# Patient Record
Sex: Male | Born: 1990 | Race: White | Hispanic: No | Marital: Single | Smoking: Never smoker
Health system: Southern US, Community
[De-identification: ages and names within clinical notes are randomized; demographics above are authoritative.]

## PROBLEM LIST (undated history)

## (undated) DIAGNOSIS — F419 Anxiety disorder, unspecified: Secondary | ICD-10-CM

## (undated) HISTORY — DX: Anxiety disorder, unspecified: F41.9

---
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
## 2011-09-01 IMAGING — CT CT ENTEROGRAPHY (ABD-PELV W/ CM)
2 of 7 series · 13 of 46 positions shown, 19 images · IV contrast (VOLUMEN & [ID] OMNI 300)
Comparison: None

CLINICAL DATA: Abdominal pain, diarrhea and melena

CT ABDOMEN AND PELVIS WITH CONTRAST (CT ENTEROGRAPHY)
TECHNIQUE: Multidetector CT of the abdomen and pelvis during bolus
administration of intravenous contrast. Negative oral contrast
VoLumen was given.
Contrast: 100 ml Lmnipaque-VYY intravenously and 13 50 ml VoLumen
orally.

[Series 3: enterography (id) · axial · 0.74mm/px · z∈[-386,-11]mm · 10 of 180 slices shown, 16 images]
[im 15/180  soft-tissue]
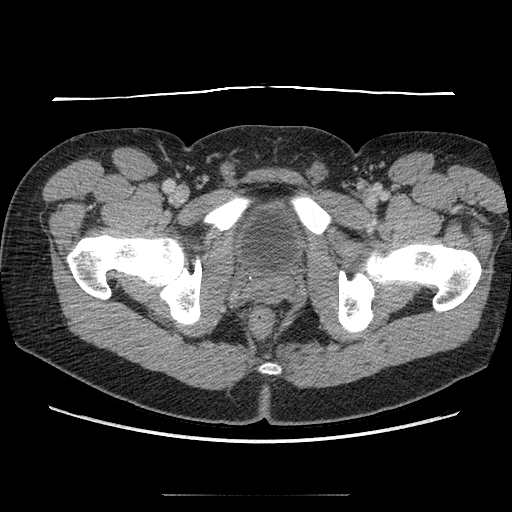
[im 15/180  bone]
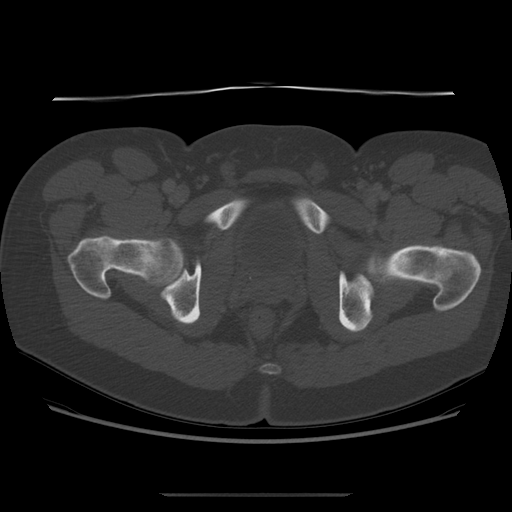
[im 30/180  soft-tissue]
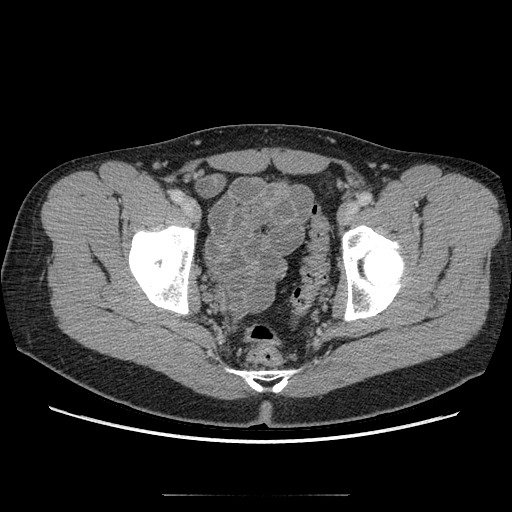
[im 45/180  soft-tissue]
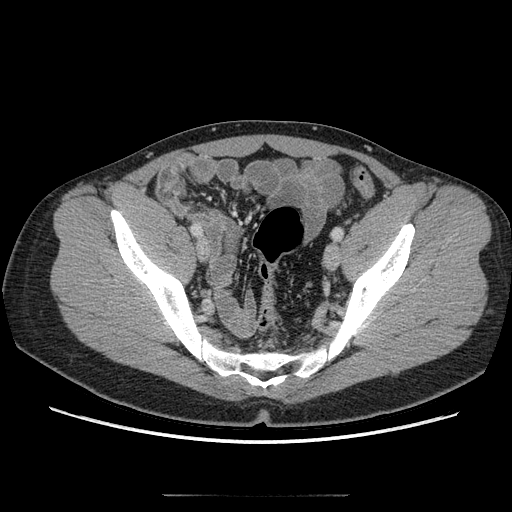
[im 60/180  soft-tissue]
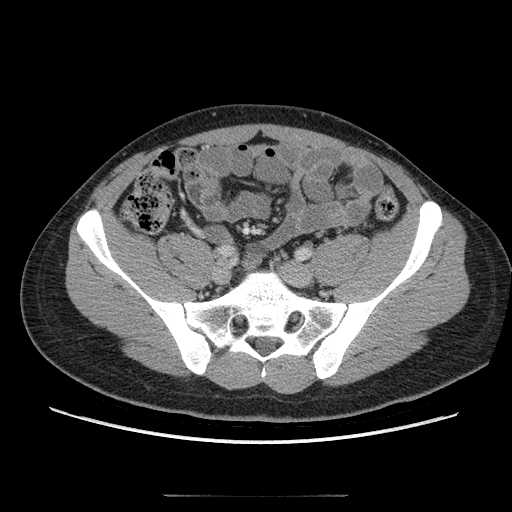
[im 75/180  soft-tissue]
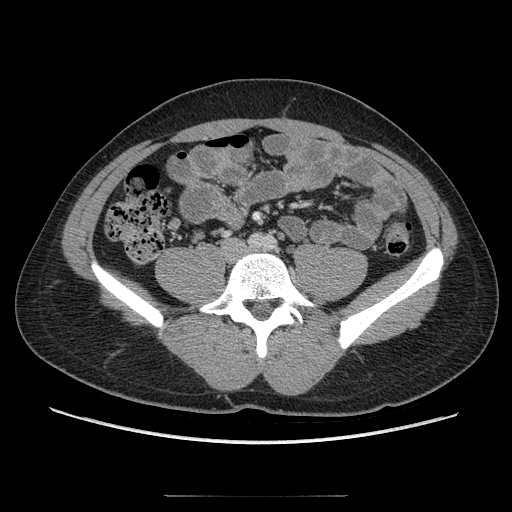
[im 105/180  soft-tissue]
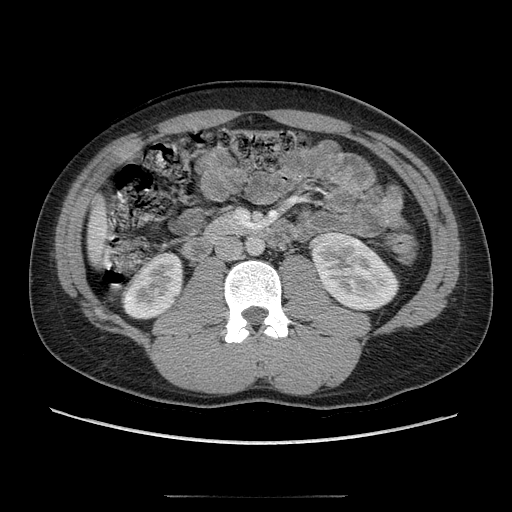
[im 120/180  soft-tissue]
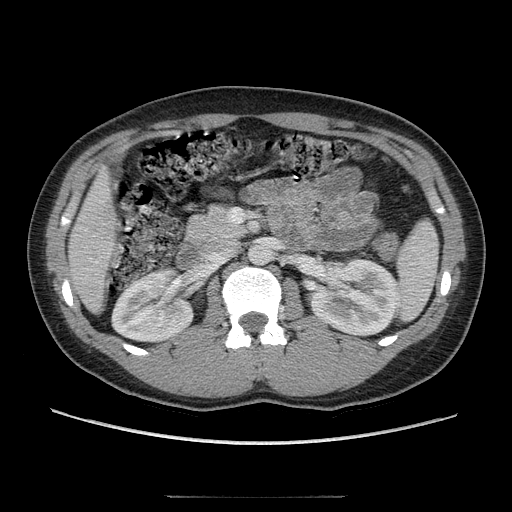
[im 120/180  lung]
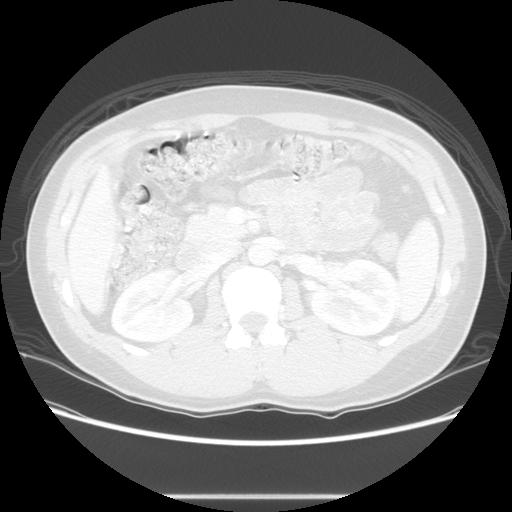
[im 135/180  soft-tissue]
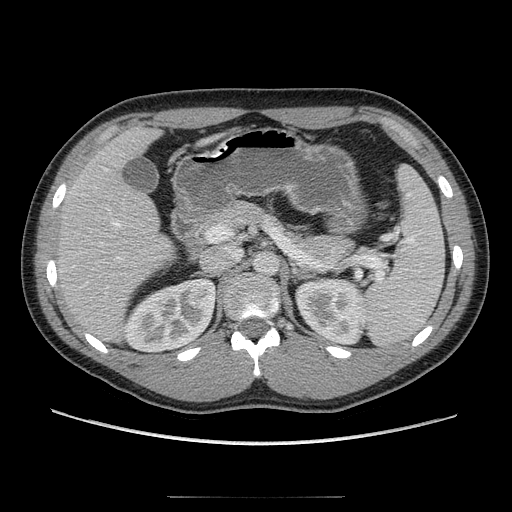
[im 135/180  lung]
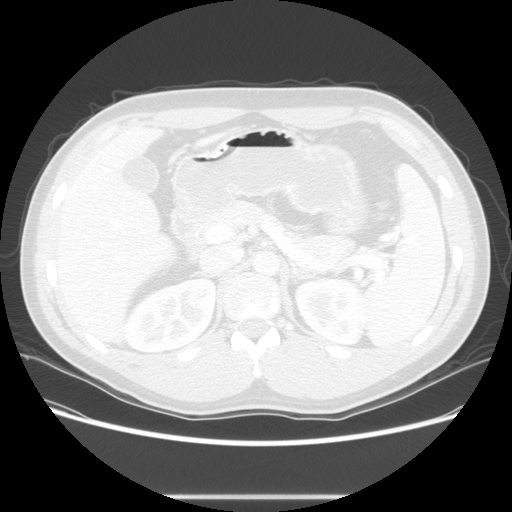
[im 150/180  soft-tissue]
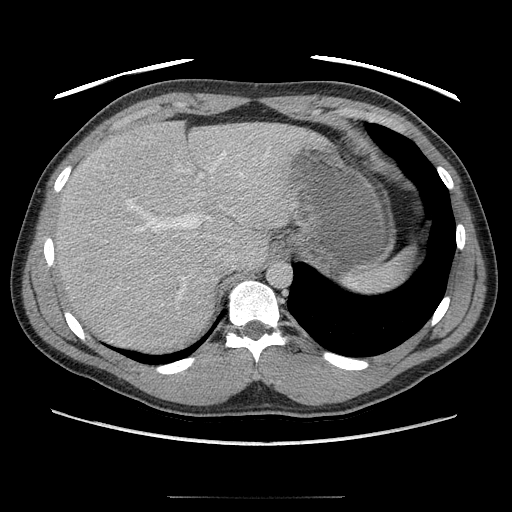
[im 150/180  lung]
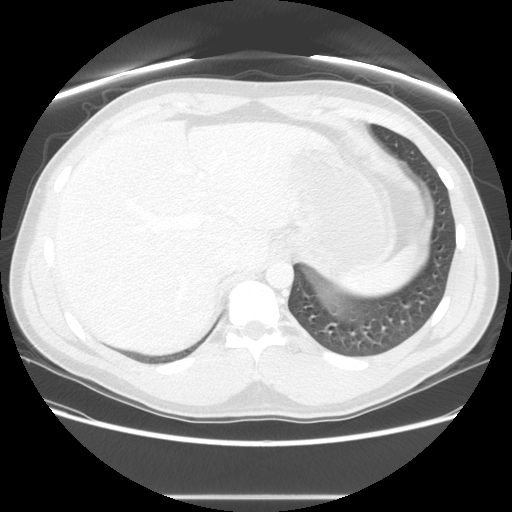
[im 150/180  bone]
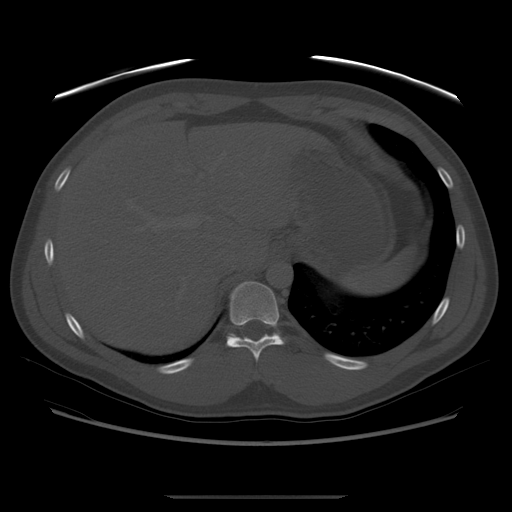
[im 165/180  soft-tissue]
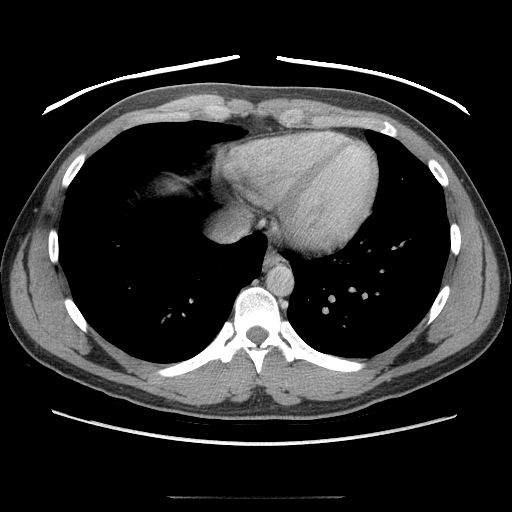
[im 165/180  lung]
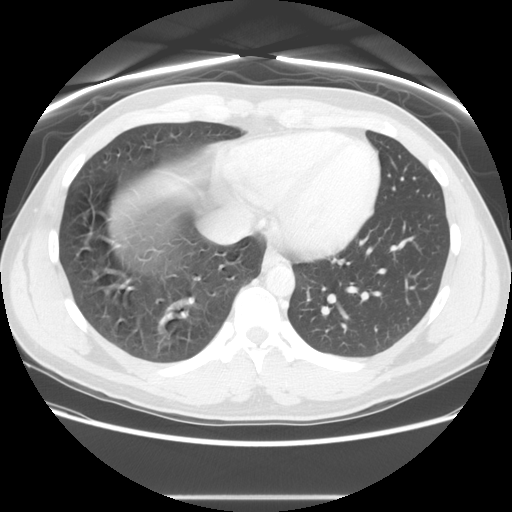

[Series 201: coronal · coronal · 0.92mm/px · 3 of 111 slices shown]
[im 37/111  soft-tissue]
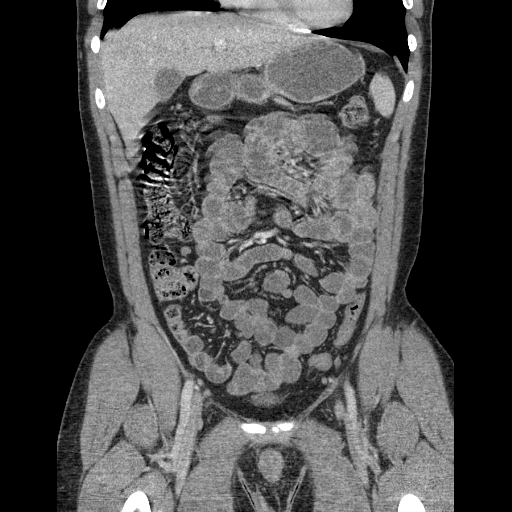
[im 49/111  soft-tissue]
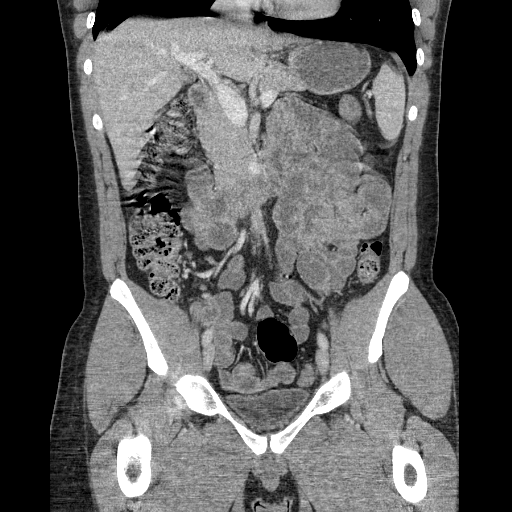
[im 62/111  soft-tissue]
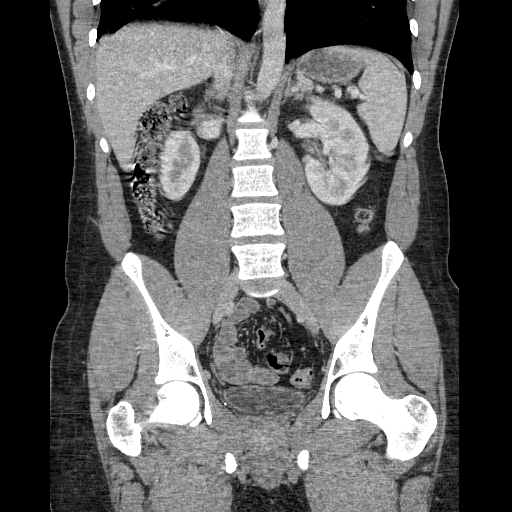

[13 of 46 positions shown; findings below may reference images not displayed]

FINDINGS: The lung bases are clear.

The patient had a difficult time with breath holding so there is
moderate motion artifact.

The liver is unremarkable.  No focal lesions or biliary dilatation
to the gallbladder is normal.  No common bile duct dilatation.  The
pancreas is normal.  The spleen is normal in size.  No focal
lesions.

The adrenal glands and kidneys are unremarkable.

The stomach, duodenum, small bowel and colon are grossly normal.
No findings for inflammatory bowel disease.  The terminal ileum is
unremarkable.  No abnormal enhancement.  Moderate stool throughout
the colon may suggest constipation.

The aorta is normal in caliber.  No dissection.  The major branch
vessels are normal.  No mesenteric or retroperitoneal masses or
adenopathy.  There are scattered mesenteric and pericecal lymph
nodes.

The bladder, prostate gland and seminal vesicles are unremarkable.
No pelvic mass, adenopathy or free pelvic fluid collections.  No
inguinal mass or hernia.  The lumbar vertebral bodies are
unremarkable.
IMPRESSION: 1.  Exam somewhat limited by breathing motion.
2.  No CT findings for inflammatory bowel disease, abdominal/pelvic
mass lesions or adenopathy.
3.  Moderate stool throughout the colon may suggest constipation.

## 2020-12-20 DIAGNOSIS — Z20822 Contact with and (suspected) exposure to covid-19: Secondary | ICD-10-CM | POA: Diagnosis not present

## 2021-01-03 DIAGNOSIS — F1120 Opioid dependence, uncomplicated: Secondary | ICD-10-CM | POA: Diagnosis not present

## 2021-01-03 DIAGNOSIS — F339 Major depressive disorder, recurrent, unspecified: Secondary | ICD-10-CM | POA: Diagnosis not present

## 2021-01-03 DIAGNOSIS — F411 Generalized anxiety disorder: Secondary | ICD-10-CM | POA: Diagnosis not present

## 2021-01-03 DIAGNOSIS — F1020 Alcohol dependence, uncomplicated: Secondary | ICD-10-CM | POA: Diagnosis not present

## 2021-01-17 DIAGNOSIS — F1020 Alcohol dependence, uncomplicated: Secondary | ICD-10-CM | POA: Diagnosis not present

## 2021-01-17 DIAGNOSIS — F339 Major depressive disorder, recurrent, unspecified: Secondary | ICD-10-CM | POA: Diagnosis not present

## 2021-01-17 DIAGNOSIS — F411 Generalized anxiety disorder: Secondary | ICD-10-CM | POA: Diagnosis not present

## 2021-01-17 DIAGNOSIS — F1120 Opioid dependence, uncomplicated: Secondary | ICD-10-CM | POA: Diagnosis not present

## 2021-02-07 DIAGNOSIS — F411 Generalized anxiety disorder: Secondary | ICD-10-CM | POA: Diagnosis not present

## 2021-02-07 DIAGNOSIS — F339 Major depressive disorder, recurrent, unspecified: Secondary | ICD-10-CM | POA: Diagnosis not present

## 2021-02-07 DIAGNOSIS — F1020 Alcohol dependence, uncomplicated: Secondary | ICD-10-CM | POA: Diagnosis not present

## 2021-02-07 DIAGNOSIS — F1120 Opioid dependence, uncomplicated: Secondary | ICD-10-CM | POA: Diagnosis not present

## 2021-12-27 ENCOUNTER — Encounter: Payer: Self-pay | Admitting: Family Medicine

## 2021-12-27 ENCOUNTER — Ambulatory Visit (INDEPENDENT_AMBULATORY_CARE_PROVIDER_SITE_OTHER): Payer: 59 | Admitting: Family Medicine

## 2021-12-27 VITALS — BP 118/78 | HR 70 | Temp 97.8°F | Ht 70.0 in | Wt 161.8 lb

## 2021-12-27 DIAGNOSIS — R3911 Hesitancy of micturition: Secondary | ICD-10-CM

## 2021-12-27 LAB — POCT URINALYSIS DIPSTICK
Bilirubin, UA: NEGATIVE
Blood, UA: NEGATIVE
Glucose, UA: NEGATIVE
Ketones, UA: NEGATIVE
Leukocytes, UA: NEGATIVE
Nitrite, UA: NEGATIVE
Protein, UA: NEGATIVE
Spec Grav, UA: <=1.005 — AB (ref 1.010–1.025)
Urobilinogen, UA: 0.2 E.U./dL
pH, UA: 6 (ref 5.0–8.0)

## 2021-12-27 NOTE — Assessment & Plan Note (Signed)
Urinary hesitancy, worsening Not associated with any other symptoms as outlined in HPI Broad differential including anything from bladder dysfunction, endocrine dysfunction, prostate hypertrophy or prostatitis, infection, medication side effects, neurological conditions, however most concerning is possible prostate malignancy, did discuss this with patient, patient acknowledged understanding seriousness of the matter  did recommend testing as outlined below but also discussed possible referral referral in the near future UA is negative, making infection less likely, however will send for urine culture PSA to evaluate for prostate enlargement We will have the patient discontinue any diuretics from home including alcohol and caffeine Zyrtec can have possible anticholinergic side effects, leading to urinary retention, will have patient discontinue this as well Patient to keep a diary of symptoms Abdominal and pelvic ultrasound to evaluate anatomy or signs of obstruction or bladder distention Patient follow-up 1 month Any abnormalities or progression of symptoms, low threshold to refer to urology

## 2021-12-27 NOTE — Progress Notes (Signed)
New Patient Office Visit  Subjective    Patient ID: Jeff Richardson, Jeff Richardson    DOB: 05-31-1991  Age: 31 y.o. MRN: [id number]  CC:  Chief Complaint  Patient presents with   Establish Care    For 3-4 months patient has been having issues starting to urinate. It feels like he has to go , but can't.     HPI Jeff Richardson presents to establish care.   Patient presents with 3 months of urinary hesitancy.  It is intermittent, but has become more frequent.  Patient does not know external pattern of this, but he is he has difficulty voiding by initiation sometimes, but other times he feels like he cannot completely empty his bladder.  Patient has not had any hematuria with this, denies discharge.  Has no erectile or ejaculatory problems.  Patient does use OTC Zyrtec for allergies.  Patient drinks occasional alcohol and does drink coffee during the day.  Patient does endorse a rash that red.  Seems to come and go.  It is worse on his neck.  It does seem to flare when he gets anxious.  This is mostly in social situations and in the public.  Patient denies any abdominal pain, nausea, vomiting, fevers, chills, constipation, diarrhea  No outpatient encounter medications on file as of 12/27/2021.   No facility-administered encounter medications on file as of 12/27/2021.    History reviewed. No pertinent past medical history.  History reviewed. No pertinent surgical history.  History reviewed. No pertinent family history.  Social History   Socioeconomic History   Marital status: Single    Spouse name: Not on file   Number of children: Not on file   Years of education: Not on file   Highest education level: Not on file  Occupational History   Not on file  Tobacco Use   Smoking status: Never   Smokeless tobacco: Never  Vaping Use   Vaping Use: Never used  Substance and Sexual Activity   Alcohol use: Never   Drug use: Never   Sexual activity: Not on file  Other Topics Concern   Not on  file  Social History Narrative   Not on file   Social Determinants of Health   Financial Resource Strain: Not on file  Food Insecurity: Not on file  Transportation Needs: Not on file  Physical Activity: Not on file  Stress: Not on file  Social Connections: Not on file  Intimate Partner Violence: Not on file    ROS As per HPI     Objective    BP 118/78 (BP Location: Right Arm, Patient Position: Sitting, Cuff Size: Large)   Pulse 70   Temp 97.8 F (36.6 C) (Temporal)   Ht 5\' 10"  (1.778 m)   Wt 161 lb 12.8 oz (73.4 kg)   SpO2 99%   BMI 23.22 kg/m   Gen: NAD, resting comfortably CV: RRR with no murmurs appreciated Pulm: NWOB, CTAB with no crackles, wheezes, or rhonchi GI: Normal bowel sounds present. Soft, Nontender, Nondistended. MSK: no edema, cyanosis, or clubbing noted Skin: warm, dry Neuro: grossly normal, moves all extremities Psych: Normal affect and thought content       Assessment & Plan:   Problem List Items Addressed This Visit       Other   Urinary hesitancy - Primary   Relevant Orders   POCT Urinalysis Dipstick (Completed)   Urine Culture   PSA, total and free   Abdomen Complete   US Pelvis  Complete    Return in about 4 weeks (around 01/24/2022) for urinary hesitancy.   Garnette Gunner, MD

## 2021-12-27 NOTE — Patient Instructions (Signed)
Stop Diuretics like Alcohol and Caffeine Keep a diary of urinary progression Stop Zyrtec Today we are getting prostate test, sending for culture, and ordering an ultrasound of bladder and kidneys

## 2021-12-28 ENCOUNTER — Telehealth: Payer: Self-pay

## 2021-12-28 LAB — URINE CULTURE
MICRO NUMBER:: 13530477
Result:: NO GROWTH
SPECIMEN QUALITY:: ADEQUATE

## 2021-12-28 NOTE — Telephone Encounter (Signed)
Jeff Richardson from [facility] called.  She needs to speak with someone regarding the referrals made on 12/28/21. US Pelvis Complete and US Abdomen Complete The dx code is for urinary hesitancy. She said it looks like it should be a renal study but she can't make that decision without a Dr's approval.  Her call back number is [phone], option 1, then option 5

## 2021-12-31 ENCOUNTER — Encounter: Payer: Self-pay | Admitting: Family Medicine

## 2021-12-31 LAB — PSA, TOTAL AND FREE
PSA, % Free: 100 % (calc) (ref >25)
PSA, Free: 0.1 ng/mL
PSA, Total: 0.1 ng/mL (ref <=4.0)

## 2021-12-31 NOTE — Telephone Encounter (Signed)
Please advise, see below.   

## 2021-12-31 NOTE — Telephone Encounter (Signed)
Left Arline Asp a detailed voice message to return call to office.

## 2022-01-23 ENCOUNTER — Telehealth: Payer: Self-pay | Admitting: Family Medicine

## 2022-01-23 NOTE — Telephone Encounter (Signed)
Pt wanted to know if he should come to his appointment tomorrow because pt never had his ultrasound done and pt stated that no one ever contact him

## 2022-01-24 ENCOUNTER — Telehealth: Payer: Self-pay | Admitting: Family Medicine

## 2022-01-24 ENCOUNTER — Ambulatory Visit: Payer: Self-pay | Admitting: Family Medicine

## 2022-01-24 NOTE — Telephone Encounter (Signed)
Spoke with patient and he states that he spoke with imaging on 6/19 and they didn't accept his Friday insurance. Patient has changed insurance companies and will bring in new info at visit today with PCP.

## 2022-01-24 NOTE — Telephone Encounter (Signed)
7.13.2023 no show letter sent

## 2022-02-15 NOTE — Telephone Encounter (Signed)
1st no show, fee waived, letter sent 

## 2022-10-23 ENCOUNTER — Encounter: Payer: Self-pay | Admitting: Family Medicine

## 2022-10-23 ENCOUNTER — Ambulatory Visit: Payer: BLUE CROSS/BLUE SHIELD | Admitting: Family Medicine

## 2022-10-23 VITALS — BP 138/80 | HR 85 | Temp 98.5°F | Ht 71.0 in | Wt 171.5 lb

## 2022-10-23 DIAGNOSIS — F4000 Agoraphobia, unspecified: Secondary | ICD-10-CM

## 2022-10-23 DIAGNOSIS — F411 Generalized anxiety disorder: Secondary | ICD-10-CM | POA: Diagnosis not present

## 2022-10-23 MED ORDER — PROPRANOLOL HCL 10 MG PO TABS: ORAL_TABLET | ORAL | 1 refills | Status: DC

## 2022-10-23 MED ORDER — CITALOPRAM HYDROBROMIDE 20 MG PO TABS: ORAL_TABLET | ORAL | 0 refills | Status: DC

## 2022-10-23 NOTE — Progress Notes (Signed)
Chief Complaint  Patient presents with   New Patient (Initial Visit)   Anxiety    Subjective Jeff Richardson is an 32 y.o. male who presents with anxiet. Symptoms began getting worse around 6 mo ago.  Anxiety symptoms: difficulty concentrating, fatigue, irritable, palpitations, psychomotor agitation, racing thoughts, sweating. Depressive symptoms None Family history significant for anxiety in mom and on her side of family Possible organic causes contributing are: none Social stressors include: his dad's worsening health No SI or HI. No self medication.  He is currently being treated with nothing; has been on 2 SSRI's in college but does not remember their names. Has been on vistaril and propranolol which worked well also.  He is not following with a psychologist.  Past Medical History:  Diagnosis Date   Anxiety      Family History Family History  Problem Relation Age of Onset   Heart disease Father    Diabetes Father    Breast cancer Maternal Grandmother    Lung cancer Maternal Grandfather    Colon cancer Neg Hx    Prostate cancer Neg Hx     Exam BP 138/80 (BP Location: Left Arm, Patient Position: Sitting, Cuff Size: Normal)   Pulse 85   Temp 98.5 F (36.9 C) (Oral)   Ht 5\' 11"  (1.803 m)   Wt 171 lb 8 oz (77.8 kg)   SpO2 99%   BMI 23.92 kg/m  General:  well developed, well nourished, in no apparent distress Heart: RRR Lungs:  CTAB. normal respiratory effort without accessory muscle use Psych: well oriented with normal range of affect and age-appropriate judgement/insight  Assessment and Plan  GAD (generalized anxiety disorder) - Plan: propranolol (INDERAL) 10 MG tablet, citalopram (CELEXA) 20 MG tablet  Agoraphobia - Plan: propranolol (INDERAL) 10 MG tablet, citalopram (CELEXA) 20 MG tablet  Chronic, uncontrolled. Start Celexa 10 mg/d for 2 weeks and then increase to 20 mg/d. Start propranolol 10 mg TID prn. Counseling info provided. Counseled on exercise. F/u in 6  weeks.  Patient voiced understanding and agreement to the plan.  Jilda Roche [place], DO 10/23/22 2:00 PM

## 2022-10-23 NOTE — Patient Instructions (Signed)
Aim to do some physical exertion for 150 minutes per week. This is typically divided into 5 days per week, 30 minutes per day. The activity should be enough to get your heart rate up. Anything is better than nothing if you have time constraints.  Please consider counseling. Contact [phone] to schedule an appointment or inquire about cost/insurance coverage.  [facility] located at [address], [place], [place].  Phone number = [phone].  Dr. Onoriode Edeh - Adult Psychiatry.    [facility] located at [address], [place], [place]. Phone number = [phone].   [facility] located at [address], [place], [place].  Phone number = [phone].   [facility] located at [address], [place], [place].  Phone number = [phone].  Coping skills Choose 5 that work for you: Take a deep breath Count to 20 Read a book Do a puzzle Meditate Bake Sing Knit Garden Pray Go outside Call a friend Listen to music Take a walk Color Send a note Take a bath Watch a movie Be alone in a quiet place Pet an animal Visit a friend Journal Exercise Stretch   

## 2022-11-28 ENCOUNTER — Other Ambulatory Visit: Payer: Self-pay | Admitting: Family Medicine

## 2022-11-28 DIAGNOSIS — F4000 Agoraphobia, unspecified: Secondary | ICD-10-CM

## 2022-11-28 DIAGNOSIS — F411 Generalized anxiety disorder: Secondary | ICD-10-CM

## 2022-11-28 MED ORDER — CITALOPRAM HYDROBROMIDE 20 MG PO TABS: ORAL_TABLET | ORAL | 0 refills | Status: DC

## 2022-12-04 ENCOUNTER — Ambulatory Visit: Payer: BLUE CROSS/BLUE SHIELD | Admitting: Family Medicine

## 2022-12-11 ENCOUNTER — Ambulatory Visit: Payer: BLUE CROSS/BLUE SHIELD | Admitting: Family Medicine

## 2022-12-11 VITALS — BP 138/86 | HR 78 | Temp 98.8°F | Ht 71.0 in | Wt 167.1 lb

## 2022-12-11 DIAGNOSIS — F411 Generalized anxiety disorder: Secondary | ICD-10-CM

## 2022-12-11 DIAGNOSIS — F4000 Agoraphobia, unspecified: Secondary | ICD-10-CM

## 2022-12-11 MED ORDER — PROPRANOLOL HCL 20 MG PO TABS: ORAL_TABLET | ORAL | 1 refills | Status: DC

## 2022-12-11 MED ORDER — CITALOPRAM HYDROBROMIDE 40 MG PO TABS: 40.0000 mg | ORAL_TABLET | Freq: Every day | ORAL | 3 refills | Status: DC

## 2022-12-11 NOTE — Progress Notes (Signed)
Chief Complaint  Patient presents with   Follow-up    Subjective Jeff Richardson presents for f/u anxiety.  Pt is currently being treated with Celexa 20 mg/d, propranolol 10 mg TID prn.  Reports 30-40% improvement since treatment. He uses the propranolol around twice per week before he goes out.  He states it does help take the edge off. No thoughts of harming self or others. No self-medication with alcohol, prescription drugs or illicit drugs. Pt is not following with a counselor/psychologist.  Past Medical History:  Diagnosis Date   Anxiety    Allergies as of 12/11/2022   No Known Allergies      Medication List        Accurate as of Dec 11, 2022  8:10 AM. If you have any questions, ask your nurse or doctor.          citalopram 40 MG tablet Commonly known as: CELEXA Take 1 tablet (40 mg total) by mouth daily. What changed:  medication strength how much to take how to take this when to take this additional instructions Changed by: Sharlene Dory, DO   propranolol 20 MG tablet Commonly known as: INDERAL Take 20 mg by mouth 3 times daily as needed for anxiety. What changed:  medication strength additional instructions Changed by: Sharlene Dory, DO        Exam BP 138/86 (BP Location: Left Arm, Patient Position: Sitting, Cuff Size: Normal)   Pulse 78   Temp 98.8 F (37.1 C) (Oral)   Ht 5\' 11"  (1.803 m)   Wt 167 lb 2 oz (75.8 kg)   SpO2 96%   BMI 23.31 kg/m  General:  well developed, well nourished, in no apparent distress Lungs:  No respiratory distress Psych: well oriented with normal range of affect and age-appropriate judgement/insight, alert and oriented x4.  Assessment and Plan  GAD (generalized anxiety disorder) - Plan: citalopram (CELEXA) 40 MG tablet  Agoraphobia - Plan: propranolol (INDERAL) 20 MG tablet  Chronic, uncontrolled. Increase Celexa from 20 mg daily to 40 mg daily.  Increase propranolol from 10 mg 3 times daily  as needed to 20 mg 3 times daily as needed.  Counseled on exercise. F/u in 6 weeks. The patient voiced understanding and agreement to the plan.  Jilda Roche [place], DO 12/11/22 8:10 AM

## 2022-12-11 NOTE — Patient Instructions (Signed)
Stay active.   Let me know if there are immediate issues.   Let us know if you need anything.

## 2023-01-24 ENCOUNTER — Ambulatory Visit (INDEPENDENT_AMBULATORY_CARE_PROVIDER_SITE_OTHER): Payer: BLUE CROSS/BLUE SHIELD | Admitting: Family Medicine

## 2023-01-24 ENCOUNTER — Encounter: Payer: Self-pay | Admitting: Family Medicine

## 2023-01-24 VITALS — BP 118/80 | HR 65 | Temp 98.9°F | Ht 71.0 in | Wt 179.1 lb

## 2023-01-24 DIAGNOSIS — Z13220 Encounter for screening for lipoid disorders: Secondary | ICD-10-CM | POA: Diagnosis not present

## 2023-01-24 DIAGNOSIS — Z0000 Encounter for general adult medical examination without abnormal findings: Secondary | ICD-10-CM | POA: Diagnosis not present

## 2023-01-24 DIAGNOSIS — Z114 Encounter for screening for human immunodeficiency virus [HIV]: Secondary | ICD-10-CM | POA: Diagnosis not present

## 2023-01-24 DIAGNOSIS — Z1159 Encounter for screening for other viral diseases: Secondary | ICD-10-CM

## 2023-01-24 DIAGNOSIS — Z23 Encounter for immunization: Secondary | ICD-10-CM

## 2023-01-24 LAB — CBC
HCT: 41.3 % (ref 39.0–52.0)
Hemoglobin: 13.6 g/dL (ref 13.0–17.0)
MCHC: 33 g/dL (ref 30.0–36.0)
MCV: 98.5 fl (ref 78.0–100.0)
Platelets: 239 10*3/uL (ref 150.0–400.0)
RBC: 4.19 Mil/uL — ABNORMAL LOW (ref 4.22–5.81)
RDW: 12.7 % (ref 11.5–15.5)
WBC: 4 10*3/uL (ref 4.0–10.5)

## 2023-01-24 LAB — COMPREHENSIVE METABOLIC PANEL
ALT: 21 U/L (ref 0–53)
AST: 21 U/L (ref 0–37)
Albumin: 4.6 g/dL (ref 3.5–5.2)
Alkaline Phosphatase: 86 U/L (ref 39–117)
BUN: 15 mg/dL (ref 6–23)
CO2: 31 mEq/L (ref 19–32)
Calcium: 9.2 mg/dL (ref 8.4–10.5)
Chloride: 104 mEq/L (ref 96–112)
Creatinine, Ser: 0.98 mg/dL (ref 0.40–1.50)
GFR: 103.13 mL/min (ref >60.00)
Glucose, Bld: 93 mg/dL (ref 70–99)
Potassium: 4.3 mEq/L (ref 3.5–5.1)
Sodium: 140 mEq/L (ref 135–145)
Total Bilirubin: 0.5 mg/dL (ref 0.2–1.2)
Total Protein: 6.6 g/dL (ref 6.0–8.3)

## 2023-01-24 LAB — LIPID PANEL
Cholesterol: 204 mg/dL — ABNORMAL HIGH (ref 0–200)
HDL: 82.4 mg/dL (ref >39.00)
LDL Cholesterol: 108 mg/dL — ABNORMAL HIGH (ref 0–99)
NonHDL: 121.11
Total CHOL/HDL Ratio: 2
Triglycerides: 67 mg/dL (ref 0.0–149.0)
VLDL: 13.4 mg/dL (ref 0.0–40.0)

## 2023-01-24 MED ORDER — CITALOPRAM HYDROBROMIDE 20 MG PO TABS: 30.0000 mg | ORAL_TABLET | Freq: Every day | ORAL | 1 refills | Status: DC

## 2023-01-24 NOTE — Progress Notes (Signed)
Chief Complaint  Patient presents with   Annual Exam    Well Male Jeff Richardson is here for a complete physical.   His last physical was >1 year ago.  Current diet: in general, a "healthy" diet.   Current exercise: [employer]- HIIT rides, strength training Weight trend: up  Fatigue out of ordinary? No. Seat belt? Yes.   Advanced directive? No  Health maintenance Tetanus- No HIV- No Hep C- No  Past Medical History:  Diagnosis Date   Anxiety      Past Surgical History:  Procedure Laterality Date   HERNIA REPAIR  1998    Medications  Current Outpatient Medications on File Prior to Visit  Medication Sig Dispense Refill   propranolol (INDERAL) 20 MG tablet Take 20 mg by mouth 3 times daily as needed for anxiety. 30 tablet 1   Allergies No Known Allergies  Family History Family History  Problem Relation Age of Onset   Heart disease Father    Diabetes Father    Breast cancer Maternal Grandmother    Lung cancer Maternal Grandfather    Colon cancer Neg Hx    Prostate cancer Neg Hx     Review of Systems: Constitutional: no fevers or chills Eye:  no recent significant change in vision Ear/Nose/Mouth/Throat:  Ears:  no hearing loss Nose/Mouth/Throat:  no complaints of nasal congestion, no sore throat Cardiovascular:  no chest pain Respiratory:  no shortness of breath Gastrointestinal:  no abdominal pain, no change in bowel habits GU:  Male: negative for dysuria Musculoskeletal/Extremities:  no pain of the joints Integumentary (Skin/Breast):  no abnormal skin lesions reported Neurologic:  no headaches Endocrine: No unexpected weight changes Hematologic/Lymphatic:  no night sweats  Exam BP 118/80 (BP Location: Left Arm, Patient Position: Sitting, Cuff Size: Normal)   Pulse 65   Temp 98.9 F (37.2 C) (Oral)   Ht 5\' 11"  (1.803 m)   Wt 179 lb 2 oz (81.3 kg)   SpO2 99%   BMI 24.98 kg/m  General:  well developed, well nourished, in no apparent distress Skin:  no  significant moles, warts, or growths Head:  no masses, lesions, or tenderness Eyes:  pupils equal and round, sclera anicteric without injection Ears:  canals without lesions, TMs shiny without retraction, no obvious effusion, no erythema Nose:  nares patent, mucosa normal Throat/Pharynx:  lips and gingiva without lesion; tongue and uvula midline; non-inflamed pharynx; no exudates or postnasal drainage Neck: neck supple without adenopathy, thyromegaly, or masses Lungs:  clear to auscultation, breath sounds equal bilaterally, no respiratory distress Cardio:  regular rate and rhythm, no bruits, no LE edema Abdomen:  abdomen soft, nontender; bowel sounds normal; no masses or organomegaly Genital (male): Deferred Rectal: Deferred Musculoskeletal:  symmetrical muscle groups noted without atrophy or deformity Extremities:  no clubbing, cyanosis, or edema, no deformities, no skin discoloration Neuro:  gait normal; deep tendon reflexes normal and symmetric Psych: well oriented with normal range of affect and appropriate judgment/insight  Assessment and Plan  Well adult exam - Plan: CBC, Comprehensive metabolic panel, Lipid panel  Encounter for hepatitis C screening test for low risk patient - Plan: Hepatitis C antibody  Screening for HIV without presence of risk factors - Plan: HIV Antibody (routine testing w rflx)   Well 32 y.o. male. Counseled on diet and exercise. Self testicular exams recommended at least monthly.  Advanced directive form provided today.  Tdap today.  Other orders as above. Follow up in 6 mo pending the above workup. The  patient voiced understanding and agreement to the plan.  Jilda Roche [place], DO 01/24/23 8:48 AM

## 2023-01-24 NOTE — Patient Instructions (Signed)
Give us 2-3 business days to get the results of your labs back.   Keep the diet clean and stay active.  Do monthly self testicular checks in the shower. You are feeling for lumps/bumps that don't belong. If you feel anything like this, let me know!  Please get me a copy of your advanced directive form at your convenience.   Let us know if you need anything.  

## 2023-01-25 LAB — HIV ANTIBODY (ROUTINE TESTING W REFLEX): HIV 1&2 Ab, 4th Generation: NONREACTIVE

## 2023-01-25 LAB — HEPATITIS C ANTIBODY: Hepatitis C Ab: NONREACTIVE

## 2023-05-15 ENCOUNTER — Other Ambulatory Visit: Payer: Self-pay | Admitting: Family Medicine

## 2023-05-15 DIAGNOSIS — F4000 Agoraphobia, unspecified: Secondary | ICD-10-CM

## 2023-07-28 ENCOUNTER — Encounter: Payer: Self-pay | Admitting: Family Medicine

## 2023-07-28 ENCOUNTER — Telehealth (INDEPENDENT_AMBULATORY_CARE_PROVIDER_SITE_OTHER): Payer: BLUE CROSS/BLUE SHIELD | Admitting: Family Medicine

## 2023-07-28 DIAGNOSIS — F411 Generalized anxiety disorder: Secondary | ICD-10-CM | POA: Insufficient documentation

## 2023-07-28 DIAGNOSIS — F4000 Agoraphobia, unspecified: Secondary | ICD-10-CM | POA: Diagnosis not present

## 2023-07-28 MED ORDER — CITALOPRAM HYDROBROMIDE 20 MG PO TABS: 30.0000 mg | ORAL_TABLET | Freq: Every day | ORAL | 3 refills | Status: AC

## 2023-07-28 NOTE — Progress Notes (Signed)
 Chief Complaint  Patient presents with   Medication Refill    Medication check    Subjective Jeff Richardson presents for f/u anxiety. We are interacting via web portal for an electronic face-to-face visit. I verified patient's ID using 2 identifiers. Patient agreed to proceed with visit via this method. Patient is at home, I am at office. Patient and I are present for visit.   Pt is currently being treated with Celexa  30 mg/d, propranolol  20 mg TID prn. Working well, compliant, no AE's.  Reports doing well since treatment. No thoughts of harming self or others. No self-medication with alcohol, prescription drugs or illicit drugs. Pt is not following with a counselor/psychologist.  Past Medical History:  Diagnosis Date   Anxiety    Allergies as of 07/28/2023   No Known Allergies      Medication List        Accurate as of July 28, 2023  4:18 PM. If you have any questions, ask your nurse or doctor.          citalopram  20 MG tablet Commonly known as: CELEXA  Take 1.5 tablets (30 mg total) by mouth daily.   propranolol  20 MG tablet Commonly known as: INDERAL  TAKE 1 TABLET BY MOUTH THREE TIMES A DAY AS NEEDED FOR ANXIETY        Exam No conversational dyspnea Age appropriate judgment and insight Nml affect and mood  Assessment and Plan  GAD (generalized anxiety disorder)  Agoraphobia  Chronic, stable.  Continue Celexa  30 mg daily, propranolol  20 mg 3 times daily as needed.  Counseling information provided via MyChart at his request.  Follow-up in 6 months for physical or as needed. The patient voiced understanding and agreement to the plan.  Mabel [place], DO 07/28/23 4:18 PM
